# Patient Record
Sex: Male | Born: 1955
Health system: Southern US, Community
[De-identification: ages and names within clinical notes are randomized; demographics above are authoritative.]

## PROBLEM LIST (undated history)

## (undated) DIAGNOSIS — I10 Essential (primary) hypertension: Secondary | ICD-10-CM

---

## 1998-01-21 ENCOUNTER — Emergency Department (HOSPITAL_COMMUNITY): Admission: EM | Admit: 1998-01-21 | Discharge: 1998-01-21 | Payer: Self-pay

## 2017-10-27 ENCOUNTER — Emergency Department (HOSPITAL_BASED_OUTPATIENT_CLINIC_OR_DEPARTMENT_OTHER)
Admission: EM | Admit: 2017-10-27 | Discharge: 2017-10-27 | Disposition: A | Discharge status: Home / Self Care | Attending: Emergency Medicine | Admitting: Emergency Medicine

## 2017-10-27 ENCOUNTER — Emergency Department (HOSPITAL_BASED_OUTPATIENT_CLINIC_OR_DEPARTMENT_OTHER)

## 2017-10-27 ENCOUNTER — Encounter (HOSPITAL_BASED_OUTPATIENT_CLINIC_OR_DEPARTMENT_OTHER): Payer: Self-pay

## 2017-10-27 ENCOUNTER — Other Ambulatory Visit: Payer: Self-pay

## 2017-10-27 DIAGNOSIS — Y9301 Activity, walking, marching and hiking: Secondary | ICD-10-CM | POA: Diagnosis not present

## 2017-10-27 DIAGNOSIS — Y929 Unspecified place or not applicable: Secondary | ICD-10-CM | POA: Diagnosis not present

## 2017-10-27 DIAGNOSIS — T148XXA Other injury of unspecified body region, initial encounter: Secondary | ICD-10-CM

## 2017-10-27 DIAGNOSIS — I10 Essential (primary) hypertension: Secondary | ICD-10-CM | POA: Insufficient documentation

## 2017-10-27 DIAGNOSIS — Y999 Unspecified external cause status: Secondary | ICD-10-CM | POA: Diagnosis not present

## 2017-10-27 DIAGNOSIS — M545 Low back pain: Secondary | ICD-10-CM

## 2017-10-27 DIAGNOSIS — S30810A Abrasion of lower back and pelvis, initial encounter: Secondary | ICD-10-CM | POA: Insufficient documentation

## 2017-10-27 DIAGNOSIS — M79644 Pain in right finger(s): Secondary | ICD-10-CM

## 2017-10-27 DIAGNOSIS — Z87891 Personal history of nicotine dependence: Secondary | ICD-10-CM | POA: Diagnosis not present

## 2017-10-27 DIAGNOSIS — S3992XA Unspecified injury of lower back, initial encounter: Secondary | ICD-10-CM | POA: Diagnosis present

## 2017-10-27 DIAGNOSIS — W0110XA Fall on same level from slipping, tripping and stumbling with subsequent striking against unspecified object, initial encounter: Secondary | ICD-10-CM | POA: Insufficient documentation

## 2017-10-27 HISTORY — DX: Essential (primary) hypertension: I10

## 2017-10-27 MED ORDER — BACITRACIN ZINC 500 UNIT/GM EX OINT
TOPICAL_OINTMENT | Freq: Once | CUTANEOUS | Status: AC
  Administered 2017-10-27: 13:00:00 via TOPICAL
  Filled 2017-10-27: qty 28.35

## 2017-10-27 MED ORDER — METHOCARBAMOL 500 MG PO TABS: 500.0000 mg | ORAL_TABLET | Freq: Two times a day (BID) | ORAL | 0 refills | Status: AC

## 2017-10-27 MED FILL — METHOCARBAMOL 500 MG TABLET: 500 | 5 days supply | Qty: 10 | Fill #0

## 2017-10-27 NOTE — ED Provider Notes (Signed)
[facility] EMERGENCY DEPARTMENT Provider Note   CSN: [id number] Arrival date & time: 10/27/17  1120     History   Chief Complaint Chief Complaint  Patient presents with  . Fall    HPI Dustin Mcpherson is a 62 y.o. male who presents for evaluation after mechanical fall that occurred approximately 3 AM this morning.  Patient reports that he was walking when he slipped on wet floor, causing him to fall backwards.  Patient reports that he hit his lower back and upper buttock area of a small metal object and had some mild abrasion.  He noted some bleeding to the area.  Patient also reports pain to the distal end of his right index finger.  Patient states that he hit the finger when he fell.  He states he did not hit his head and did not have any LOC.  He is not currently on any blood thinners.  Patient has been able to ambulate without any difficulty since the incident.  Patient denies any numbness/weakness of extremities, saddle anesthesia, urinary or bowel incontinence.  The history is provided by the patient.    Past Medical History:  Diagnosis Date  . Hypertension     There are no active problems to display for this patient.   History reviewed. No pertinent surgical history.      Home Medications    Prior to Admission medications   Medication Sig Start Date End Date Taking? Authorizing Provider  methocarbamol (ROBAXIN) 500 MG tablet Take 1 tablet (500 mg total) by mouth 2 (two) times daily. 10/27/17   Maxwell Caul, PA-C    Family History No family history on file.  Social History Social History   Tobacco Use  . Smoking status: Former Games developer  . Smokeless tobacco: Never Used  Substance Use Topics  . Alcohol use: Not Currently  . Drug use: Never     Allergies   Flagyl [metronidazole] and Prinivil [lisinopril]   Review of Systems Review of Systems  Musculoskeletal: Positive for back pain. Negative for neck pain.  Skin: Positive for wound.    Neurological: Negative for weakness and numbness.     Physical Exam Updated Vital Signs BP 138/80   Pulse 60   Temp 97.9 F (36.6 C)   Resp 16   Ht 5\' 9"  (1.753 m)   Wt 76.3 kg (168 lb 4.8 oz)   SpO2 100%   BMI 24.85 kg/m   Physical Exam  Neck: Full passive range of motion without pain.  Full flexion/extension and lateral movement of neck fully intact. No bony midline tenderness. No deformities or crepitus.   Cardiovascular:  Pulses:      Radial pulses are 2+ on the right side, and 2+ on the left side.  Musculoskeletal:       Cervical back: He exhibits no tenderness.       Thoracic back: He exhibits no tenderness.       Back:  Diffuse muscular tenderness overlying the entire lumbar region that extends midline.  No deformities or crepitus noted.  No step-offs.  Limited flexion/extension secondary to pain.  Tenderness palpation noted to the distal tip of the right index finger.  No deformity or crepitus noted.  Flexion/extension of all 5 digits of right hand intact without any difficulty.  Flexion/extension of the DIP of the right index finger intact when held in isolation.  Patient easily able to make a fist.  No snuffbox tenderness noted.  No tenderness palpation to right  wrist, right elbow, right shoulder.  Abnormalities of the left upper extremity.  Neurological:  Follows commands, Moves all extremities  5/5 strength to BUE and BLE  Sensation intact throughout all major nerve distributions  Skin:        ED Treatments / Results  Labs (all labs ordered are listed, but only abnormal results are displayed) Labs Reviewed - No data to display  EKG None  Radiology Dg Lumbar Spine Complete  Result Date: 10/27/2017 CLINICAL DATA:  Low back pain after fall. EXAM: LUMBAR SPINE - COMPLETE 4+ VIEW COMPARISON:  None. FINDINGS: Five lumbar type vertebral bodies. No acute fracture or subluxation. Vertebral body heights are preserved. Alignment is normal. Mild disc height loss  and facet arthropathy at L5-S1. Remaining intervertebral disc spaces are maintained. The sacroiliac joints are unremarkable. IMPRESSION: 1.  No acute osseous abnormality. 2. Mild degenerative changes at L5-S1. Electronically Signed   By: Obie Dredge M.D.   On: 10/27/2017 12:36   Dg Finger Index Right  Result Date: 10/27/2017 CLINICAL DATA:  Right index finger tip pain after fall. EXAM: RIGHT INDEX FINGER 2+V COMPARISON:  None. FINDINGS: No acute fracture or dislocation. Minimal degenerative changes of the DIP joint. Bone mineralization is normal. Soft tissues are unremarkable. IMPRESSION: No acute osseous abnormality. Electronically Signed   By: Obie Dredge M.D.   On: 10/27/2017 12:37    Procedures Procedures (including critical care time)  Medications Ordered in ED Medications  bacitracin ointment ( Topical Given 10/27/17 1238)     Initial Impression / Assessment and Plan / ED Course  I have reviewed the triage vital signs and the nursing notes.  Pertinent labs & imaging results that were available during my care of the patient were reviewed by me and considered in my medical decision making (see chart for details).     62 year old male who presents after mechanical fall that occurred approximately 3 AM this morning.  No head injury, LOC.  Patient is not currently on blood thinners.  Reports he hit his lower back and upper buttock on a sharp metal corner.  Reports an abrasion to the area.  Also reports lower back pain, right index finger pain. Patient is afebrile, non-toxic appearing, sitting comfortably on examination table. Vital signs reviewed and stable.  She is slightly bradycardic with a pulse of 51.  Review of records show that patient has a history of bradycardia.  When he was seen in his primary care office in October 2018, heart rate was 48.  Patient is neurovascularly intact.  On exam, patient has a 3 cm abrasion to the left buttock.  No laceration noted.  Need to apply  bacitracin and dressing.  Patient also with tenderness overlying the entire lumbar region.  Plan for XR evaluation.  X-rays reviewed.  No acute abnormality seen on index finger x-ray.  Lumbar x-ray shows no acute bony abnormality.  Discussed results with patient.  Symptoms likely result of muscle skeletal strain.  Plan to provide symptom medic relief with Robaxin.  Encourage at home supportive therapies. Patient had ample opportunity for questions and discussion. All patient's questions were answered with full understanding. Strict return precautions discussed. Patient expresses understanding and agreement to plan.   Final Clinical Impressions(s) / ED Diagnoses   Final diagnoses:  Abrasion  Acute bilateral low back pain, with sciatica presence unspecified  Pain of finger of right hand    ED Discharge Orders        Ordered    methocarbamol (ROBAXIN) 500  MG tablet  2 times daily     10/27/17 1320       Rosana HoesLayden, Devann Cribb A, PA-C 10/27/17 1504    Cathren LaineSteinl, Kevin, MD 10/27/17 [phone]

## 2017-10-27 NOTE — ED Notes (Signed)
Pt spoke with Brain Deslauriers, plant manager in triage-pt does not need UDS

## 2017-10-27 NOTE — Discharge Instructions (Signed)
Keep the wound clean and dry for the first 24 hours. After that you may gently clean the wound with soap and water. Make sure to pat dry the wound before covering it with any dressing. You can use topical antibiotic ointment and bandage. Ice and elevate for pain relief.   You can take Tylenol or Ibuprofen as directed for pain. You can alternate Tylenol and Ibuprofen every 4 hours for additional pain relief.   Take Robaxin as prescribed. This medication will make you drowsy so do not drive or drink alcohol when taking it.  Follow-up with your primary care doctor or the referred [facility] in the next 2 to 4 days for further evaluation.  Monitor closely for any signs of infection. Return to the Emergency Department for any worsening redness/swelling of the area that begins to spread, drainage from the site, worsening pain, fever or any other worsening or concerning symptoms.  Return the emergency department for any worsening back pain, difficulty walking, weakness or numbness of her arms or legs, loss of control of your bowel or bladder or any other worsening or concerning symptoms.

## 2017-10-27 NOTE — ED Triage Notes (Signed)
Pt slipped on wet flor at week approx 320am-lower back struck metal rack-reports small break in skin with bandaid in place and pain to lower back/tail bone area and pain to right index finger tip-no break in skin-NAD-steady gait

## 2019-10-26 IMAGING — DX DG LUMBAR SPINE COMPLETE 4+V
5 series · 5 of 5 positions shown · non-contrast
Comparison: None.

CLINICAL DATA: Low back pain after fall.

EXAM:
LUMBAR SPINE - COMPLETE 4+ VIEW

[l-spine ap]
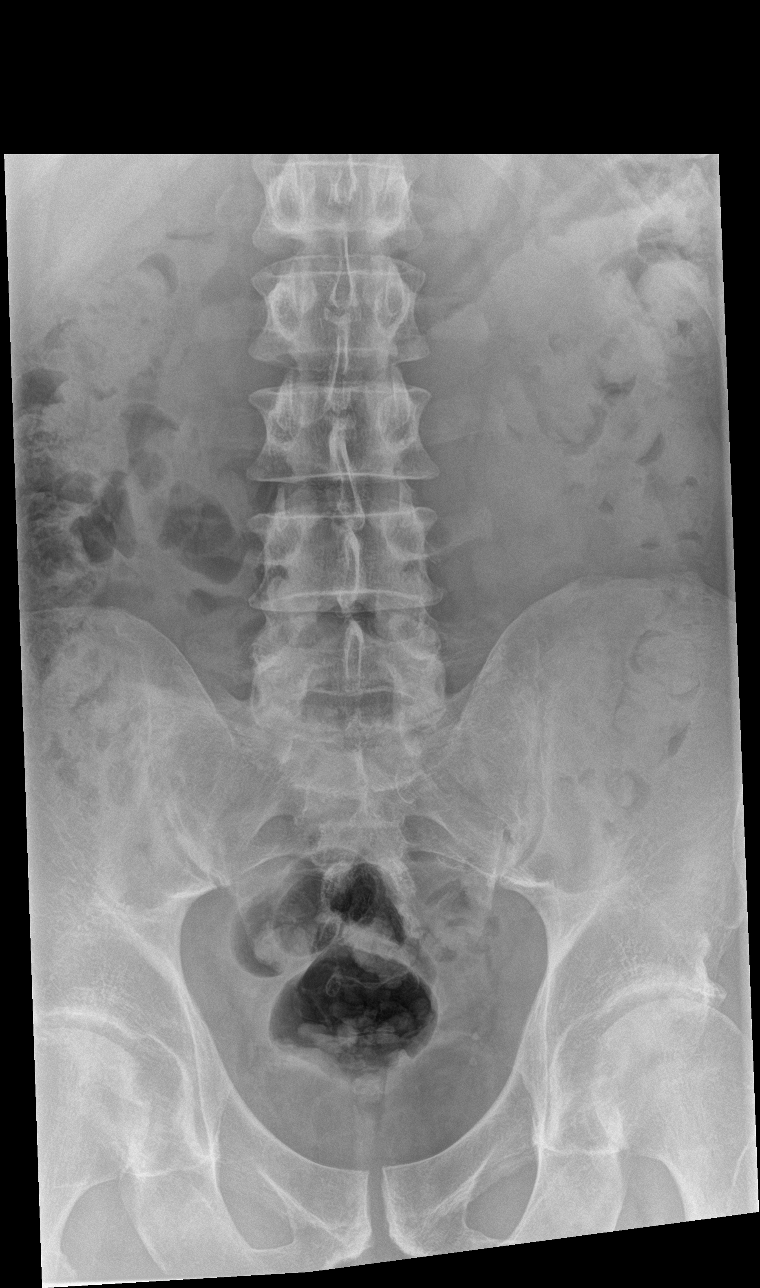

[l-spine obl (1 of 2)]
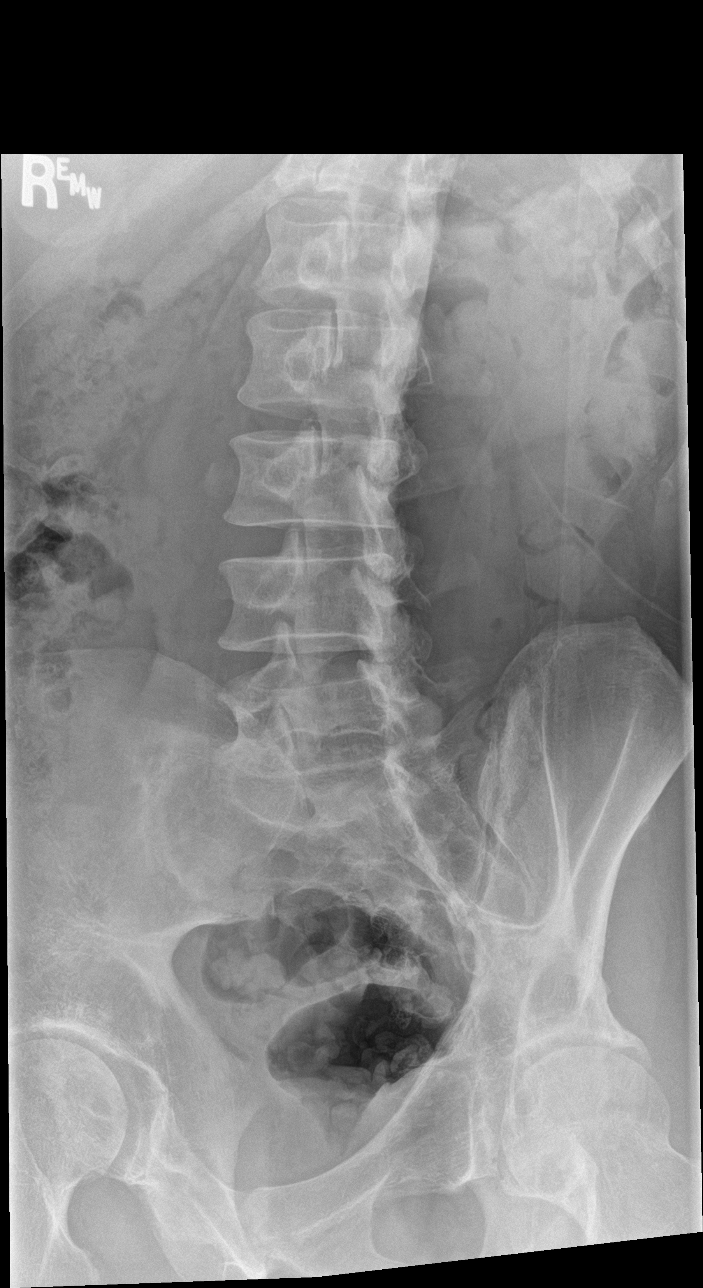

[l-spine obl (2 of 2)]
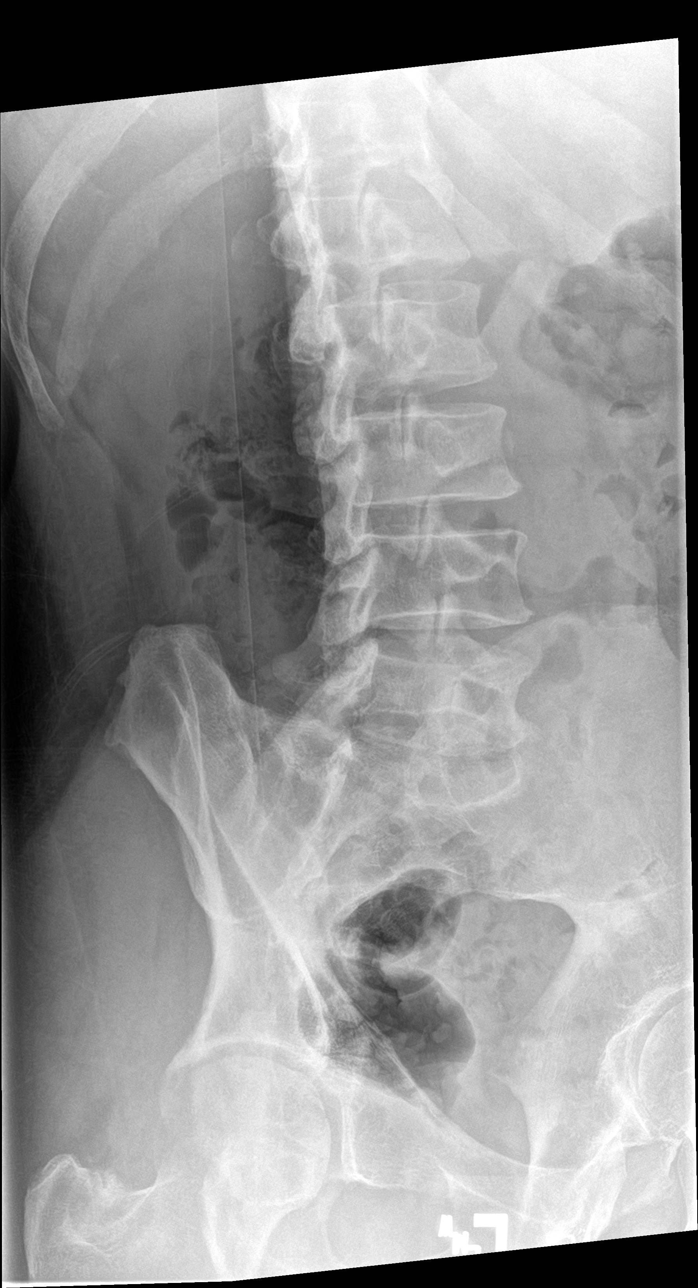

[l-spine lat]
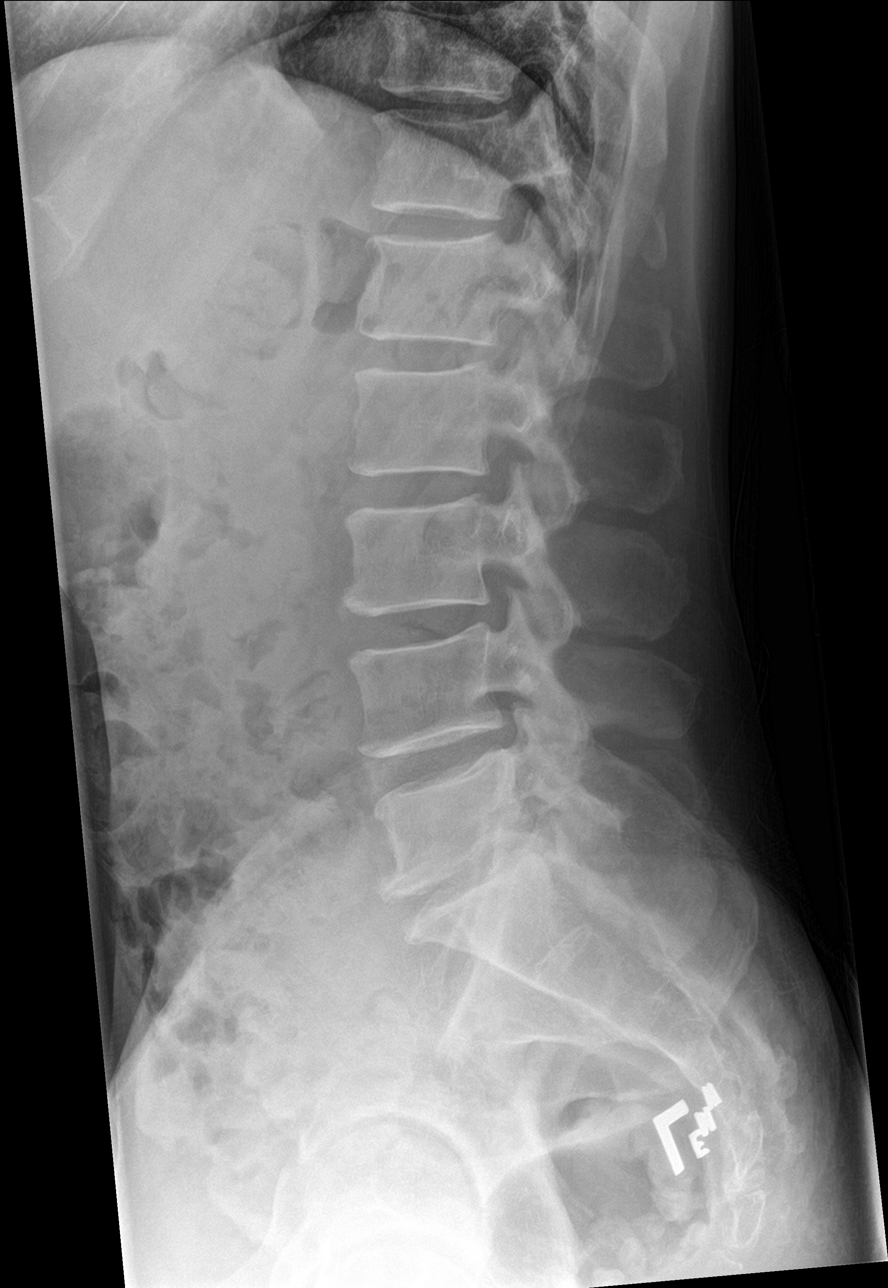

[l-spine spot]
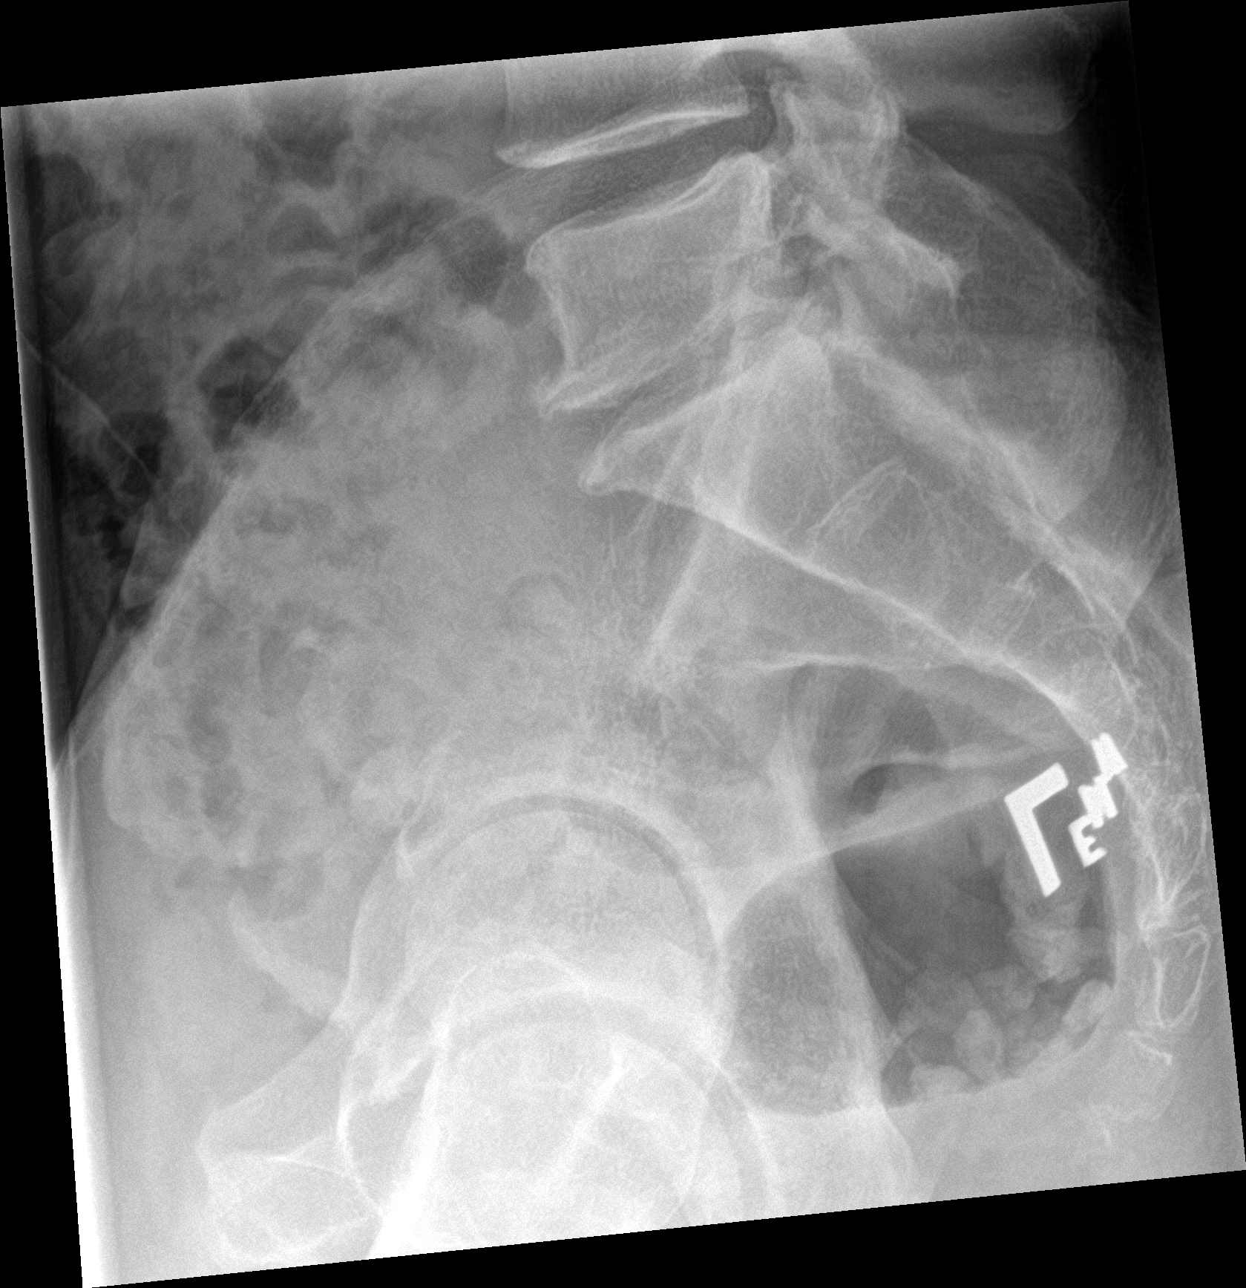

[5 of 5 positions shown; findings below may reference images not displayed]

FINDINGS: Five lumbar type vertebral bodies. No acute fracture or subluxation.
Vertebral body heights are preserved. Alignment is normal. Mild disc
height loss and facet arthropathy at L5-S1. Remaining intervertebral
disc spaces are maintained. The sacroiliac joints are unremarkable.
IMPRESSION: 1.  No acute osseous abnormality.
2. Mild degenerative changes at L5-S1.
